# Patient Record
Sex: Male | Born: 1940 | Race: White | Hispanic: No | Marital: Married
Health system: Southern US, Community
[De-identification: ages and names within clinical notes are randomized; demographics above are authoritative.]

---

## 2005-05-19 ENCOUNTER — Ambulatory Visit: Payer: Self-pay | Admitting: Internal Medicine

## 2007-09-19 ENCOUNTER — Ambulatory Visit: Payer: Self-pay | Admitting: Unknown Physician Specialty

## 2008-03-11 ENCOUNTER — Inpatient Hospital Stay: Payer: Self-pay | Admitting: Internal Medicine

## 2008-07-11 ENCOUNTER — Ambulatory Visit: Payer: Self-pay | Admitting: Internal Medicine

## 2009-06-19 ENCOUNTER — Emergency Department: Payer: Self-pay | Admitting: Emergency Medicine

## 2011-01-05 IMAGING — CT CT CHEST-ABD-PELV W/ CM
1 of 2 series · 14 of 32 positions shown, 18 images · IV contrast (APPLIED)
Comparison: None

REASON FOR EXAM: (1) [place] pain; (2) bilat groin pain  eval for dissection
COMMENTS:

PROCEDURE:     CT  - CT CHEST ABDOMEN AND PELVIS W  - June 19, 2009  [DATE]
RESULT:     CT CHEST, ABDOMEN, AND PELVIS
HISTORY: Chest pain
TECHNIQUE: Multiple axial images obtained from the thoracic inlet to the
pubic symphysis, without p.o. contrast and with 100 ml of Isovue 370
intravenous contrast.

[Series 4: aaa · axial · 0.80mm/px · z∈[+678,+1272]mm · 14 of 216 slices shown, 18 images]
[im 9/216  soft-tissue]
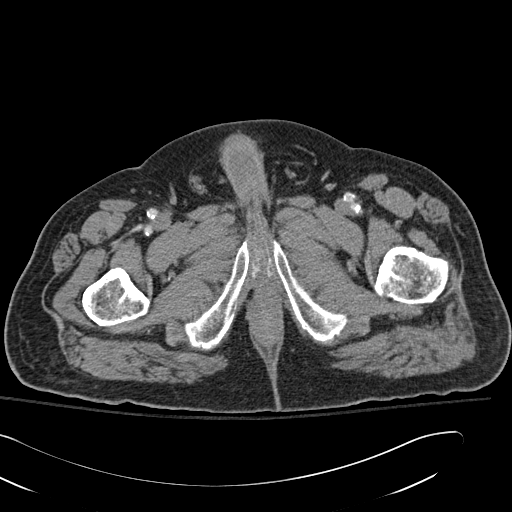
[im 9/216  bone]
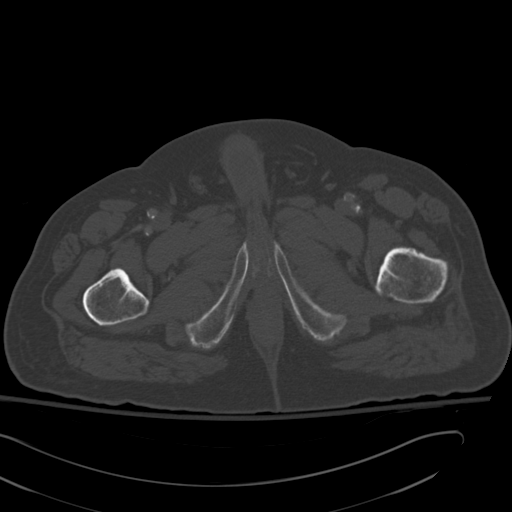
[im 27/216  soft-tissue]
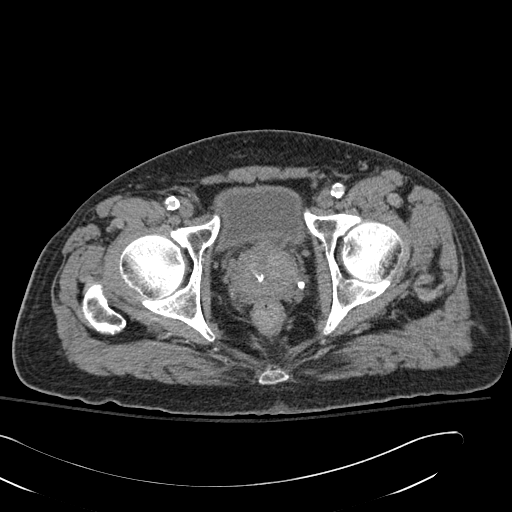
[im 45/216  soft-tissue]
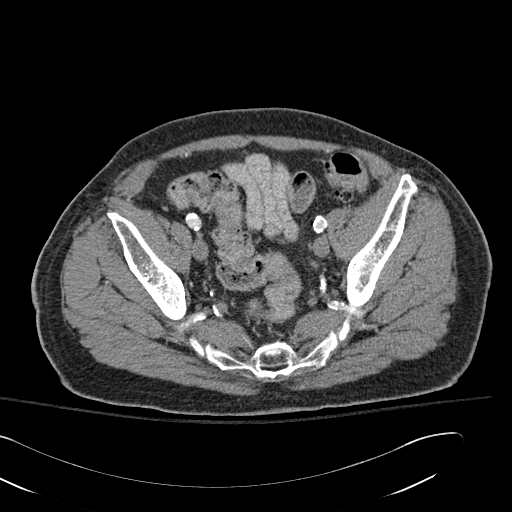
[im 63/216  soft-tissue]
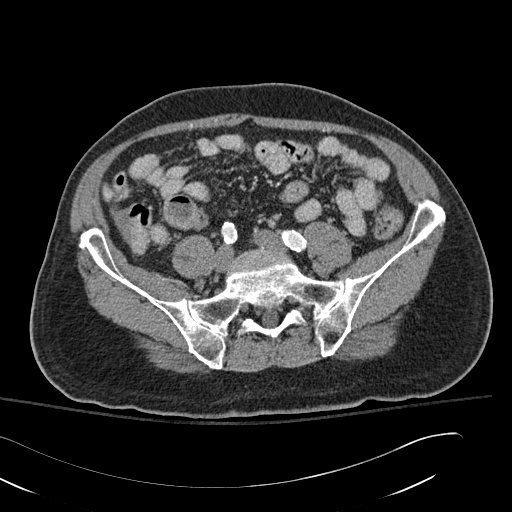
[im 81/216  soft-tissue]
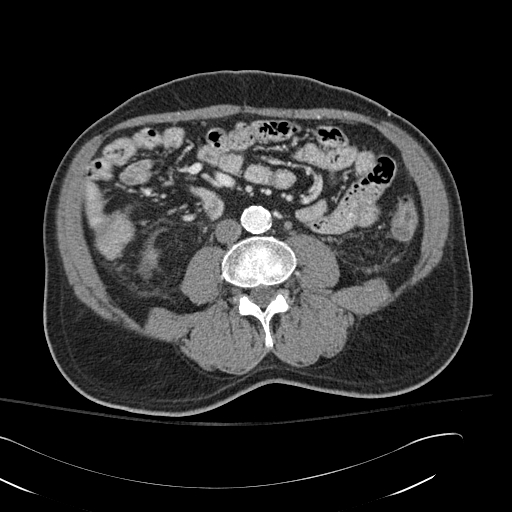
[im 99/216  soft-tissue]
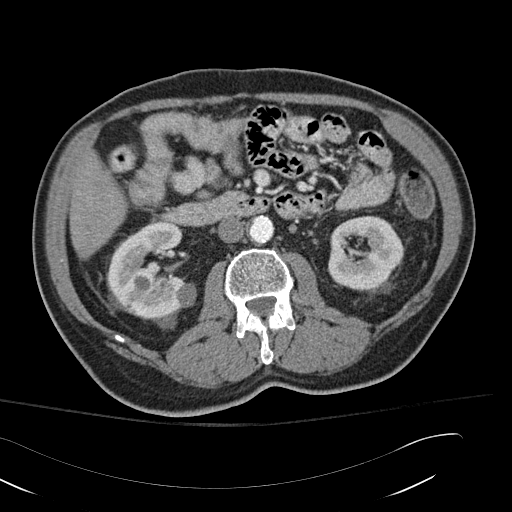
[im 117/216  soft-tissue]
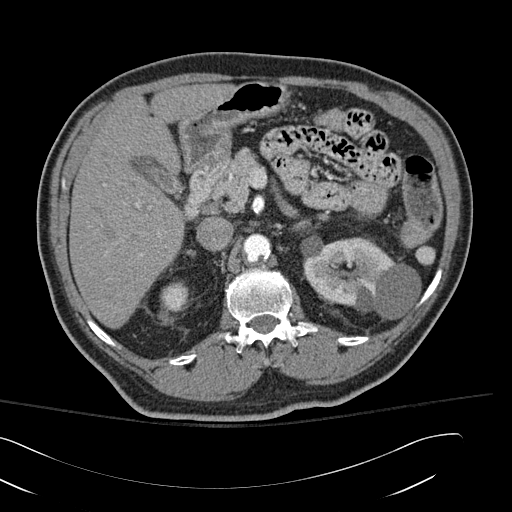
[im 135/216  soft-tissue]
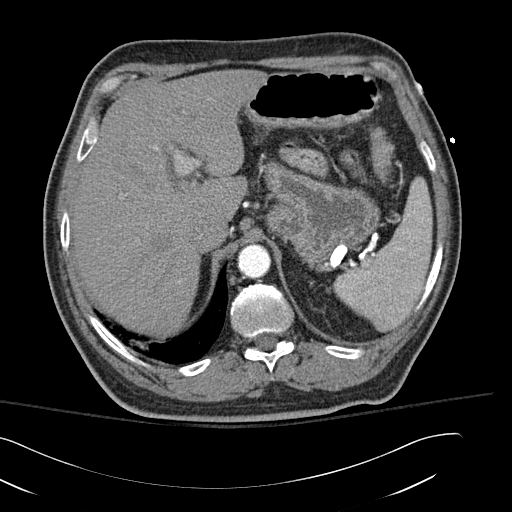
[im 153/216  soft-tissue]
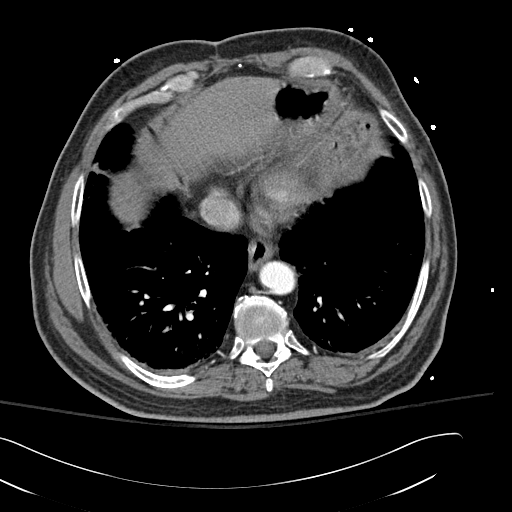
[im 153/216  bone]
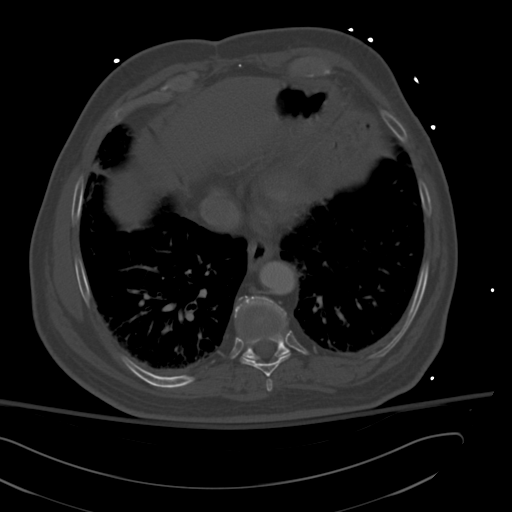
[im 171/216  soft-tissue]
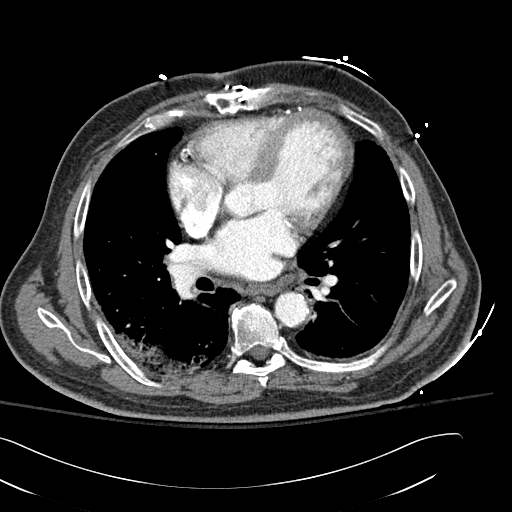
[im 180/216  lung]
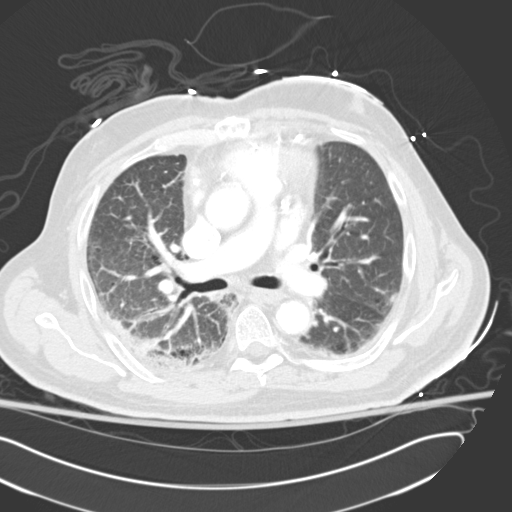
[im 189/216  soft-tissue]
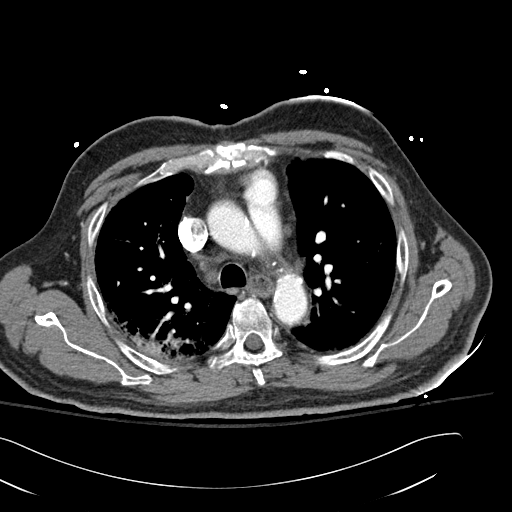
[im 189/216  lung]
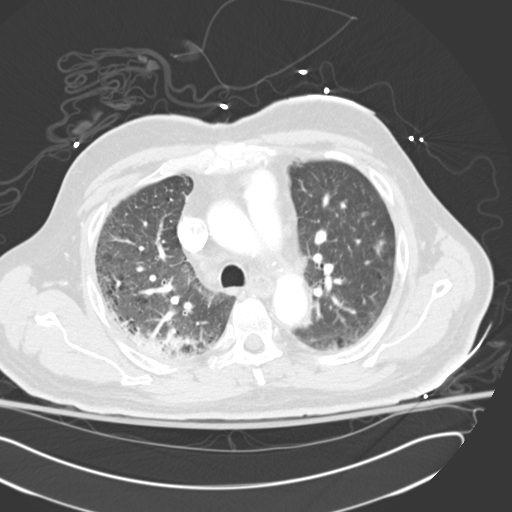
[im 198/216  lung]
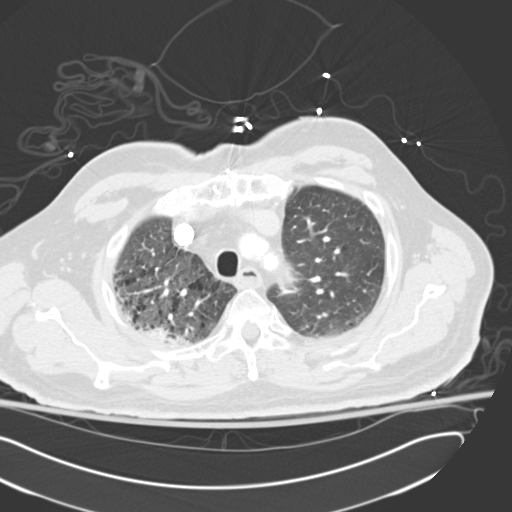
[im 207/216  soft-tissue]
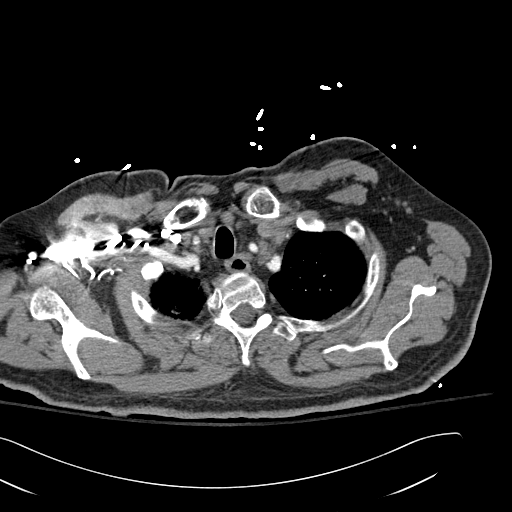
[im 207/216  lung]
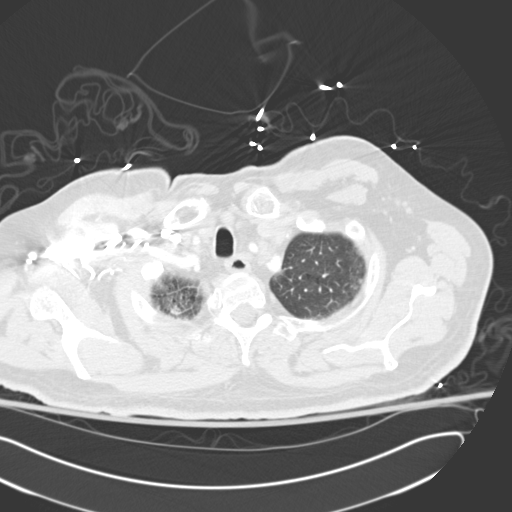

[14 of 32 positions shown; findings below may reference images not displayed]

FINDINGS: CHEST:

There is airspace disease in the posterior segment of the right upper lobe.
There is a noncalcified, nonspecific 7 mm left upper lobe pulmonary nodule.
There is no pleural effusion or pneumothorax. There are bilateral
emphysematous changes.

The heart size is normal. There is no pericardial effusion. There is
evidence of prior CABG.

There are no pathologically enlarged mediastinal, hilar, or axillary lymph
nodes.

The osseous structures demonstrate no focal abnormality.

ABDOMEN/PELVIS:

The liver demonstrates no focal abnormality. There is no intrahepatic or
extrahepatic biliary ductal dilatation. The gallbladder is distended. There
is pericholecystic fluid present. There are cholelithiasis.. The spleen
demonstrates no focal abnormality. The adrenal glands, pancreas are normal.
The bladder is unremarkable. There is prostatic enlargement. Correlate with
PSA and physical exam. There is a 4.5 cm left renal mass measuring 20
Hounsfield units which is unchanged compared with the prior examination of
03/12/2008 and may represent a proteinaceous cyst. There bilateral hypodense
renal masses likely resenting multiple renal cysts.

The stomach, duodenum, small intestine, and large intestine demonstrate no
contrast extravasation or dilatation. There is no pneumoperitoneum,
pneumatosis, or portal venous gas. There is no abdominal or pelvic free
fluid. There is no lymphadenopathy.

The abdominal aorta is normal in caliber with atherosclerosis.

The osseous structures are unremarkable.
IMPRESSION: 1. Cholelithiasis with a distended gallbladder and pericholecystic fluid
concerning for cholecystitis. Correlate with clinical exam and laboratory
values.

2. There is prostatic enlargement. Correlate with PSA and physical exam.

3. Indeterminate left renal mass. Recommend followup renal mass CT for
further characterization.

4. There is airspace disease in the posterior segment of the right upper
lobe concerning for pneumonia versus atelectasis. Recommend followup
radiography to document complete resolution following adequate medical
therapy. If there is not complete resolution, then recommend further
evaluation with CT of the chest to exclude underlying pathology.

5.  There is a noncalcified, nonspecific 7 mm left upper lobe pulmonary
nodule. Recommend followup CT of the chest without intravenous contrast in
3-6 months.

## 2011-02-15 ENCOUNTER — Ambulatory Visit: Payer: Self-pay | Admitting: Unknown Physician Specialty

## 2012-10-30 ENCOUNTER — Ambulatory Visit: Payer: Self-pay | Admitting: Ophthalmology

## 2012-11-06 ENCOUNTER — Ambulatory Visit: Payer: Self-pay | Admitting: Ophthalmology

## 2014-06-20 NOTE — Op Note (Signed)
PATIENT NAME:  Gary Santos, Gary Santos MR#:  [id number] DATE OF BIRTH:  [birth year]  DATE OF PROCEDURE:  11/06/2012  PREOPERATIVE DIAGNOSIS: Visually significant cataract of the left eye.   POSTOPERATIVE DIAGNOSIS: Visually significant cataract of the left eye.   OPERATIVE PROCEDURE: Cataract extraction by phacoemulsification with implant of intraocular lens to left eye.   SURGEON: Galen ManilaWilliam Arion Shankles, MD  ANESTHESIA:  1. Managed anesthesia care.  2. Topical tetracaine drops followed by 2% Xylocaine jelly applied in the preoperative holding area.   COMPLICATIONS: None.   TECHNIQUE:  Stop and chop.   DESCRIPTION OF PROCEDURE: The patient was examined and consented in the preoperative holding area where the aforementioned topical anesthesia was applied to the left eye and then brought back to the Operating Room where the left eye was prepped and draped in the usual sterile ophthalmic fashion and a lid speculum was placed. A paracentesis was created with the side port blade and the anterior chamber was filled with viscoelastic. A near clear corneal incision was performed with the steel keratome. A continuous curvilinear capsulorrhexis was performed with a cystotome followed by the capsulorrhexis forceps. Hydrodissection and hydrodelineation were carried out with BSS on a blunt cannula. The lens was removed in a stop and chop technique and the remaining cortical material was removed with the irrigation-aspiration handpiece. The capsular bag was inflated with viscoelastic and the Tecnis ZCB00, 21.5 diopter lens, serial number [id number] was placed in the capsular bag without complication. The remaining viscoelastic was removed from the eye with the irrigation-aspiration handpiece. The wounds were hydrated. The anterior chamber was flushed with Miostat and the eye was inflated to physiologic pressure. 0.1 mL of cefuroxime concentration 10 mg/mL was placed in the anterior chamber. The wounds were found to be water  tight. The eye was dressed with Vigamox. The patient was given protective glasses to wear throughout the day and a shield with which to sleep tonight. The patient was also given drops with which to begin a drop regimen today and will follow-up with me in one day.     ____________________________ Gary FieldWilliam L. Bergen Melle, MD wlp:mr D: 11/06/2012 17:32:51 ET T: 11/06/2012 20:19:26 ET JOB#: [id number]  cc: Sayyid Harewood L. Jacyln Carmer, MD, <Dictator> Gary FieldWILLIAM L Cadyn Rodger MD ELECTRONICALLY SIGNED 11/19/2012 16:54

## 2019-06-29 DEATH — deceased
